# Patient Record
Sex: Female | Born: 1955 | Hispanic: No | Marital: Single | Smoking: Former smoker
Health system: Southern US, Community
[De-identification: ages and names within clinical notes are randomized; demographics above are authoritative.]

## PROBLEM LIST (undated history)

## (undated) DIAGNOSIS — D219 Benign neoplasm of connective and other soft tissue, unspecified: Secondary | ICD-10-CM

## (undated) DIAGNOSIS — I10 Essential (primary) hypertension: Secondary | ICD-10-CM

---

## 2008-07-08 ENCOUNTER — Emergency Department (HOSPITAL_COMMUNITY): Admission: EM | Admit: 2008-07-08 | Discharge: 2008-07-08 | Payer: Self-pay | Admitting: Emergency Medicine

## 2008-07-12 ENCOUNTER — Emergency Department (HOSPITAL_COMMUNITY): Admission: EM | Admit: 2008-07-12 | Discharge: 2008-07-12 | Payer: Self-pay | Admitting: Emergency Medicine

## 2009-03-08 ENCOUNTER — Ambulatory Visit: Payer: Self-pay | Admitting: Internal Medicine

## 2013-06-09 ENCOUNTER — Encounter (HOSPITAL_COMMUNITY): Payer: Self-pay | Admitting: Emergency Medicine

## 2013-06-09 ENCOUNTER — Emergency Department (HOSPITAL_COMMUNITY): Payer: Worker's Compensation

## 2013-06-09 ENCOUNTER — Emergency Department (HOSPITAL_COMMUNITY)
Admission: EM | Admit: 2013-06-09 | Discharge: 2013-06-09 | Disposition: A | Discharge status: Home / Self Care | Payer: Worker's Compensation | Attending: Emergency Medicine | Admitting: Emergency Medicine

## 2013-06-09 DIAGNOSIS — W010XXA Fall on same level from slipping, tripping and stumbling without subsequent striking against object, initial encounter: Secondary | ICD-10-CM | POA: Insufficient documentation

## 2013-06-09 DIAGNOSIS — Y9389 Activity, other specified: Secondary | ICD-10-CM | POA: Insufficient documentation

## 2013-06-09 DIAGNOSIS — F17200 Nicotine dependence, unspecified, uncomplicated: Secondary | ICD-10-CM | POA: Insufficient documentation

## 2013-06-09 DIAGNOSIS — Y929 Unspecified place or not applicable: Secondary | ICD-10-CM | POA: Insufficient documentation

## 2013-06-09 DIAGNOSIS — S8392XA Sprain of unspecified site of left knee, initial encounter: Secondary | ICD-10-CM

## 2013-06-09 DIAGNOSIS — IMO0002 Reserved for concepts with insufficient information to code with codable children: Secondary | ICD-10-CM | POA: Insufficient documentation

## 2013-06-09 MED ORDER — NAPROXEN 500 MG PO TABS: 500.0000 mg | ORAL_TABLET | Freq: Two times a day (BID) | ORAL | Status: AC

## 2013-06-09 NOTE — Progress Notes (Signed)
  CARE MANAGEMENT ED NOTE 06/09/2013  Patient:  Meghan [name], Meghan [name]   Account Number:  [id number]  Date Initiated:  06/09/2013  Documentation initiated by:  Edwyna Shell  Subjective/Objective Assessment:   58 yo female presenting to the ED after [name] past fall and c/o pain of her knees     Subjective/Objective Assessment Detail:   Meghan [name] is [name] 58 y.o. female who presents to the Emergency Department complaining of [name] fall that occurred on 03/31/13. States she tripped and fell onto both of her legs. Pt states she had sudden onset pain that resolved but her left knee started hurting again 3 days ago. She has associated knee swelling. Bearing weight and certain movements worsen the pain. Pt has taken aleve and iced with little relief.     Action/Plan:   Patient is to follow up with provided resources to establish care with [name] PCP   Action/Plan Detail:   Anticipated DC Date:       Status Recommendation to Physician:   Result of Recommendation:  Agreed    DC Planning Services  CM consult  Medication Assistance  PCP issues    Choice offered to / List presented to:  C-1 Patient          Status of service:  Completed, signed off  ED Comments:   ED Comments Detail:  CM consulted for PCP concerns. CM spoke with patient and she confirmed that she did not have [name] PCP. Discussed the importance of [name] PCP especially with health and medications management as she will not receive these services in the ED. Patient verbalized understanding. Reviewed community resources in the community and encouraged the patient to contact one of the established Clinics for medical, medications, and financial counseling. Reviewed specifically the [facility], [facility] at [facility], and [facility]. Patient verbalized understanding and appreciation and was provided with the written resources. EDP updated.

## 2013-06-09 NOTE — ED Notes (Signed)
Case management at bedside with patient.

## 2013-06-09 NOTE — ED Notes (Signed)
Patient states she had a mechanical fall on 05/31/13 and she hurt both her legs.   Patient states she has had swelling and pain in L knee starting on Saturday.

## 2013-06-09 NOTE — ED Provider Notes (Signed)
CSN: [id number]     Arrival date & time 06/09/13  8938 History  This chart was scribed for non-physician practitioner, Jeannett Senior, PA-C working with Merryl Hacker, MD by Frederich Balding, ED scribe. This patient was seen in room TR07C/TR07C and the patient's care was started at 10:39 AM.   Chief Complaint  Patient presents with  . Knee Pain   The history is provided by the patient. No language interpreter was used.   HPI Comments: Meghan [name] is a 58 y.o. female who presents to the Emergency Department complaining of a fall that occurred on 03/31/13. States she tripped and fell onto both of her legs. Pt states she had sudden onset pain that resolved but her left knee started hurting again 3 days ago. She has associated knee swelling. Bearing weight and certain movements worsen the pain. Pt has taken aleve and iced with little relief.   History reviewed. No pertinent past medical history. History reviewed. No pertinent past surgical history. No family history on file. History  Substance Use Topics  . Smoking status: Current Every Day Smoker -- 0.10 packs/day    Types: Cigarettes  . Smokeless tobacco: Not on file  . Alcohol Use: Yes   OB History   Grav Para Term Preterm Abortions TAB SAB Ect Mult Living                 Review of Systems  Musculoskeletal: Positive for arthralgias and joint swelling.  All other systems reviewed and are negative.  Allergies  Review of patient's allergies indicates no known allergies.  Home Medications   Prior to Admission medications   Not on File   BP 177/82  Pulse 71  Temp(Src) 97.6 F (36.4 C) (Oral)  Resp 18  Ht 5\' 3"  (1.6 m)  Wt 160 lb (72.576 kg)  BMI 28.35 kg/m2  SpO2 100%  Physical Exam  Nursing note and vitals reviewed. Constitutional: She is oriented to person, place, and time. She appears well-developed and well-nourished. No distress.  HENT:  Head: Normocephalic and atraumatic.  Eyes: EOM are normal.  Neck:  Neck supple. No tracheal deviation present.  Cardiovascular: Normal rate.   Pulmonary/Chest: Effort normal. No respiratory distress.  Musculoskeletal: Normal range of motion.  Normal appearing left knee. Tender to palpation over medial joint. Full ROM of the knee joint. Pain with full flexion and extension. Negative anterior and posterior drawer signs. No laxity or pain with medial or lateral stress. Dorsal pedal pulses intact   Neurological: She is alert and oriented to person, place, and time.  Skin: Skin is warm and dry.  Psychiatric: She has a normal mood and affect. Her behavior is normal.    ED Course  Procedures (including critical care time)  DIAGNOSTIC STUDIES: Oxygen Saturation is 100% on RA, normal by my interpretation.    COORDINATION OF CARE: 10:43 AM-Discussed treatment plan which includes a knee sleeve and an anti-inflammatory with pt at bedside and pt agreed to plan. Will give pt PCP referrals and advised her to follow up.   Labs Review Labs Reviewed - No data to display  Imaging Review Dg Knee Complete 4 Views Left  06/09/2013   CLINICAL DATA:  LEFT KNEE PAIN  EXAM: LEFT KNEE - COMPLETE 4+ VIEW  COMPARISON:  None.  FINDINGS: There is no evidence of fracture, dislocation, or joint effusion. There is no evidence of arthropathy or other focal bone abnormality. Soft tissues are unremarkable.  IMPRESSION: No acute abnormality noted.   Electronically  Signed   By: Inez Catalina M.D.   On: 06/09/2013 10:26     EKG Interpretation None      MDM   Final diagnoses:  None    Patient with mechanical fall in March. Now having pain in the left knee. Knee exam unremarkable. X-rays negative. No signs of infection to the joint. Patient is ambulatory with no difficulty. Suspect patient has a sprain to the knee versus meniscal injury based on history. I have given her a knee sleeve, home with Naprosyn, followup with orthopedics Dr.  Danley Danker Vitals:   06/09/13 1005  BP: 177/82   Pulse: 71  Temp: 97.6 F (36.4 C)  TempSrc: Oral  Resp: 18  Height: 5\' 3"  (1.6 m)  Weight: 160 lb (72.576 kg)  SpO2: 100%     I personally performed the services described in this documentation, which was scribed in my presence. The recorded information has been reviewed and is accurate.  Renold Genta, PA-C 06/09/13 1806

## 2013-06-09 NOTE — ED Notes (Signed)
Case management called per PA request.   Patient needs help with getting primary care physician and medications.

## 2013-06-09 NOTE — Discharge Instructions (Signed)
Keep knee elevated, ice, wear knee sleeve. Avoid excessive use. Do knee exercises. Follow up with primary care doctor.   Knee Pain Knee pain can be a result of an injury or other medical conditions. Treatment will depend on the cause of your pain. HOME CARE  Only take medicine as told by your doctor.  Keep a healthy weight. Being overweight can make the knee hurt more.  Stretch before exercising or playing sports.  If there is constant knee pain, change the way you exercise. Ask your doctor for advice.  Make sure shoes fit well. Choose the right shoe for the sport or activity.  Protect your knees. Wear kneepads if needed.  Rest when you are tired. GET HELP RIGHT AWAY IF:   Your knee pain does not stop.  Your knee pain does not get better.  Your knee joint feels hot to the touch.  You have a fever. MAKE SURE YOU:   Understand these instructions.  Will watch this condition.  Will get help right away if you are not doing well or get worse. Document Released: 03/30/2008 Document Revised: 03/26/2011 Document Reviewed: 03/30/2008 [facility] Patient Information 2014 [place], [place]. Knee Exercises EXERCISES RANGE OF MOTION(ROM) AND STRETCHING EXERCISES These exercises may help you when beginning to rehabilitate your injury. Your symptoms may resolve with or without further involvement from your physician, physical therapist or athletic trainer. While completing these exercises, remember:   Restoring tissue flexibility helps normal motion to return to the joints. This allows healthier, less painful movement and activity.  An effective stretch should be held for at least 30 seconds.  A stretch should never be painful. You should only feel a gentle lengthening or release in the stretched tissue. STRETCH - Knee Extension, Prone  Lie on your stomach on a firm surface, such as a bed or countertop. Place your right / left knee and leg just beyond the edge of the surface. You may wish  to place a towel under the far end of your right / left thigh for comfort.  Relax your leg muscles and allow gravity to straighten your knee. Your clinician may advise you to add an ankle weight if more resistance is helpful for you.  You should feel a stretch in the back of your right / left knee. Hold this position for __________ seconds. Repeat __________ times. Complete this stretch __________ times per day. * Your physician, physical therapist or athletic trainer may ask you to add ankle weight to enhance your stretch.  RANGE OF MOTION - Knee Flexion, Active  Lie on your back with both knees straight. (If this causes back discomfort, bend your opposite knee, placing your foot flat on the floor.)  Slowly slide your heel back toward your buttocks until you feel a gentle stretch in the front of your knee or thigh.  Hold for __________ seconds. Slowly slide your heel back to the starting position. Repeat __________ times. Complete this exercise __________ times per day.  STRETCH - Quadriceps, Prone   Lie on your stomach on a firm surface, such as a bed or padded floor.  Bend your right / left knee and grasp your ankle. If you are unable to reach, your ankle or pant leg, use a belt around your foot to lengthen your reach.  Gently pull your heel toward your buttocks. Your knee should not slide out to the side. You should feel a stretch in the front of your thigh and/or knee.  Hold this position for __________ seconds.  Repeat __________ times. Complete this stretch __________ times per day.  STRETCH  Hamstrings, Supine   Lie on your back. Loop a belt or towel over the ball of your right / left foot.  Straighten your right / left knee and slowly pull on the belt to raise your leg. Do not allow the right / left knee to bend. Keep your opposite leg flat on the floor.  Raise the leg until you feel a gentle stretch behind your right / left knee or thigh. Hold this position for __________  seconds. Repeat __________ times. Complete this stretch __________ times per day.  STRENGTHENING EXERCISES These exercises may help you when beginning to rehabilitate your injury. They may resolve your symptoms with or without further involvement from your physician, physical therapist or athletic trainer. While completing these exercises, remember:   Muscles can gain both the endurance and the strength needed for everyday activities through controlled exercises.  Complete these exercises as instructed by your physician, physical therapist or athletic trainer. Progress the resistance and repetitions only as guided.  You may experience muscle soreness or fatigue, but the pain or discomfort you are trying to eliminate should never worsen during these exercises. If this pain does worsen, stop and make certain you are following the directions exactly. If the pain is still present after adjustments, discontinue the exercise until you can discuss the trouble with your clinician. STRENGTH - Quadriceps, Isometrics  Lie on your back with your right / left leg extended and your opposite knee bent.  Gradually tense the muscles in the front of your right / left thigh. You should see either your knee cap slide up toward your hip or increased dimpling just above the knee. This motion will push the back of the knee down toward the floor/mat/bed on which you are lying.  Hold the muscle as tight as you can without increasing your pain for __________ seconds.  Relax the muscles slowly and completely in between each repetition. Repeat __________ times. Complete this exercise __________ times per day.  STRENGTH - Quadriceps, Short Arcs   Lie on your back. Place a __________ inch towel roll under your knee so that the knee slightly bends.  Raise only your lower leg by tightening the muscles in the front of your thigh. Do not allow your thigh to rise.  Hold this position for __________ seconds. Repeat  __________ times. Complete this exercise __________ times per day.  OPTIONAL ANKLE WEIGHTS: Begin with ____________________, but DO NOT exceed ____________________. Increase in 1 pound/0.5 kilogram increments.  STRENGTH - Quadriceps, Straight Leg Raises  Quality counts! Watch for signs that the quadriceps muscle is working to insure you are strengthening the correct muscles and not "cheating" by substituting with healthier muscles.  Lay on your back with your right / left leg extended and your opposite knee bent.  Tense the muscles in the front of your right / left thigh. You should see either your knee cap slide up or increased dimpling just above the knee. Your thigh may even quiver.  Tighten these muscles even more and raise your leg 4 to 6 inches off the floor. Hold for __________ seconds.  Keeping these muscles tense, lower your leg.  Relax the muscles slowly and completely in between each repetition. Repeat __________ times. Complete this exercise __________ times per day.  STRENGTH - Hamstring, Curls  Lay on your stomach with your legs extended. (If you lay on a bed, your feet may hang over the edge.)  Tighten the muscles in the back of your thigh to bend your right / left knee up to 90 degrees. Keep your hips flat on the bed/floor.  Hold this position for __________ seconds.  Slowly lower your leg back to the starting position. Repeat __________ times. Complete this exercise __________ times per day.  OPTIONAL ANKLE WEIGHTS: Begin with ____________________, but DO NOT exceed ____________________. Increase in 1 pound/0.5 kilogram increments.  STRENGTH  Quadriceps, Squats  Stand in a door frame so that your feet and knees are in line with the frame.  Use your hands for balance, not support, on the frame.  Slowly lower your weight, bending at the hips and knees. Keep your lower legs upright so that they are parallel with the door frame. Squat only within the range that does not  increase your knee pain. Never let your hips drop below your knees.  Slowly return upright, pushing with your legs, not pulling with your hands. Repeat __________ times. Complete this exercise __________ times per day.  STRENGTH - Quadriceps, Wall Slides  Follow guidelines for form closely. Increased knee pain often results from poorly placed feet or knees.  Lean against a smooth wall or door and walk your feet out 18-24 inches. Place your feet hip-width apart.  Slowly slide down the wall or door until your knees bend __________ degrees.* Keep your knees over your heels, not your toes, and in line with your hips, not falling to either side.  Hold for __________ seconds. Stand up to rest for __________ seconds in between each repetition. Repeat __________ times. Complete this exercise __________ times per day. * Your physician, physical therapist or athletic trainer will alter this angle based on your symptoms and progress. Document Released: 11/15/2004 Document Revised: 03/26/2011 Document Reviewed: 04/15/2008 [facility] Patient Information 2014 [place], [place].

## 2013-06-09 NOTE — ED Provider Notes (Signed)
Medical screening examination/treatment/procedure(s) were performed by non-physician practitioner and as supervising physician I was immediately available for consultation/collaboration.   EKG Interpretation None       Merryl Hacker, MD 06/09/13 [place]

## 2014-03-26 ENCOUNTER — Other Ambulatory Visit: Payer: Self-pay

## 2014-03-26 DIAGNOSIS — Z1231 Encounter for screening mammogram for malignant neoplasm of breast: Secondary | ICD-10-CM

## 2014-04-09 ENCOUNTER — Ambulatory Visit
Admission: RE | Admit: 2014-04-09 | Discharge: 2014-04-09 | Disposition: A | Discharge status: Home / Self Care | Payer: 59 | Source: Ambulatory Visit

## 2014-04-09 DIAGNOSIS — Z1231 Encounter for screening mammogram for malignant neoplasm of breast: Secondary | ICD-10-CM

## 2014-04-12 ENCOUNTER — Other Ambulatory Visit: Payer: Self-pay | Admitting: Family Medicine

## 2014-04-12 DIAGNOSIS — R928 Other abnormal and inconclusive findings on diagnostic imaging of breast: Secondary | ICD-10-CM

## 2014-04-13 ENCOUNTER — Ambulatory Visit
Admission: RE | Admit: 2014-04-13 | Discharge: 2014-04-13 | Disposition: A | Discharge status: Home / Self Care | Payer: 59 | Source: Ambulatory Visit | Attending: Family Medicine | Admitting: Family Medicine

## 2014-04-13 DIAGNOSIS — R928 Other abnormal and inconclusive findings on diagnostic imaging of breast: Secondary | ICD-10-CM

## 2014-05-20 ENCOUNTER — Other Ambulatory Visit (HOSPITAL_COMMUNITY)
Admission: RE | Admit: 2014-05-20 | Discharge: 2014-05-20 | Disposition: A | Discharge status: Home / Self Care | Payer: 59 | Source: Ambulatory Visit | Attending: Obstetrics & Gynecology | Admitting: Obstetrics & Gynecology

## 2014-05-20 ENCOUNTER — Other Ambulatory Visit (HOSPITAL_COMMUNITY): Payer: Self-pay | Admitting: Obstetrics & Gynecology

## 2014-05-20 DIAGNOSIS — Z1151 Encounter for screening for human papillomavirus (HPV): Secondary | ICD-10-CM | POA: Insufficient documentation

## 2014-05-20 DIAGNOSIS — Z01419 Encounter for gynecological examination (general) (routine) without abnormal findings: Secondary | ICD-10-CM | POA: Insufficient documentation

## 2014-05-20 DIAGNOSIS — Z113 Encounter for screening for infections with a predominantly sexual mode of transmission: Secondary | ICD-10-CM | POA: Insufficient documentation

## 2014-05-21 LAB — CYTOLOGY - PAP

## 2014-11-22 ENCOUNTER — Emergency Department (HOSPITAL_COMMUNITY)
Admission: EM | Admit: 2014-11-22 | Discharge: 2014-11-22 | Disposition: A | Discharge status: Home / Self Care | Payer: 59 | Attending: Emergency Medicine | Admitting: Emergency Medicine

## 2014-11-22 ENCOUNTER — Emergency Department (HOSPITAL_COMMUNITY): Payer: 59

## 2014-11-22 ENCOUNTER — Encounter (HOSPITAL_COMMUNITY): Payer: Self-pay | Admitting: *Deleted

## 2014-11-22 DIAGNOSIS — H109 Unspecified conjunctivitis: Secondary | ICD-10-CM | POA: Insufficient documentation

## 2014-11-22 DIAGNOSIS — Z7982 Long term (current) use of aspirin: Secondary | ICD-10-CM | POA: Insufficient documentation

## 2014-11-22 DIAGNOSIS — Z791 Long term (current) use of non-steroidal anti-inflammatories (NSAID): Secondary | ICD-10-CM | POA: Insufficient documentation

## 2014-11-22 DIAGNOSIS — Z720 Tobacco use: Secondary | ICD-10-CM | POA: Insufficient documentation

## 2014-11-22 DIAGNOSIS — J069 Acute upper respiratory infection, unspecified: Secondary | ICD-10-CM | POA: Insufficient documentation

## 2014-11-22 LAB — RAPID STREP SCREEN (MED CTR MEBANE ONLY): Streptococcus, Group A Screen (Direct): NEGATIVE

## 2014-11-22 MED ORDER — AZITHROMYCIN 250 MG PO TABS: ORAL_TABLET | ORAL | Status: AC

## 2014-11-22 MED ORDER — POLYMYXIN B-TRIMETHOPRIM 10000-0.1 UNIT/ML-% OP SOLN
1.0000 [drp] | Freq: Once | OPHTHALMIC | Status: AC
  Administered 2014-11-22: 1 [drp] via OPHTHALMIC
  Filled 2014-11-22: qty 10

## 2014-11-22 MED ORDER — POLYMYXIN B-TRIMETHOPRIM 10000-0.1 UNIT/ML-% OP SOLN: 1.0000 [drp] | OPHTHALMIC | Status: AC

## 2014-11-22 NOTE — ED Provider Notes (Signed)
CSN: [id number]     Arrival date & time 11/22/14  1217 History  By signing my name below, I, Stephania Fragmin, attest that this documentation has been prepared under the direction and in the presence of [employer], PA-C. Electronically Signed: Stephania Fragmin, ED Scribe. 11/22/2014. 2:14 PM.    Chief Complaint  Patient presents with  . Sore Throat   The history is provided by the patient. No language interpreter was used.   HPI Comments: Meghan [name] is a 59 y.o. female who presents to the Emergency Department with multiple complaints, including a gradual-onset constant, sore throat that began 4 days ago. Associated symptoms include a subjective fever, although she states she did not measure it, a productive cough with phlegm, rhinorrhea, eye redness, and tight ear pain. Patient has a history of frequent strep throat and states this feels like strep. Patient states she is an occasional smoker. She states she is in between primary care providers at this point. She denies chest pain or SOB, abdominal pain, nausea, vomiting, calf pain, or leg swelling.   History reviewed. No pertinent past medical history. History reviewed. No pertinent past surgical history. History reviewed. No pertinent family history. Social History  Substance Use Topics  . Smoking status: Current Every Day Smoker -- 0.10 packs/day    Types: Cigarettes  . Smokeless tobacco: None  . Alcohol Use: Yes   OB History    No data available     Review of Systems A complete 10 system review of systems was obtained and all systems are negative except as noted in the HPI and PMH.    Allergies  Review of patient's allergies indicates no known allergies.  Home Medications   Prior to Admission medications   Medication Sig Start Date End Date Taking? Authorizing Provider  aspirin EC 81 MG tablet Take 81 mg by mouth daily.    Historical Provider, MD  naproxen (NAPROSYN) 500 MG tablet Take 1 tablet (500 mg total) by mouth 2 (two)  times daily. 06/09/13   Tatyana Kirichenko, PA-C   BP 177/84 mmHg  Pulse 72  Temp(Src) 98 F (36.7 C) (Oral)  Resp 18  SpO2 96% Physical Exam  Constitutional: She is oriented to person, place, and time. She appears well-developed and well-nourished. No distress.  HENT:  Head: Normocephalic and atraumatic.  Mouth/Throat: No oropharyngeal exudate.  No tonsillar hypertrophy or exudate.   Eyes: Conjunctivae and EOM are normal. Right eye exhibits discharge.  The left eye is slightly injected, with clear drainage.   Neck: Neck supple. No tracheal deviation present.  There is no anterior cervical lymphadenopathy.   Cardiovascular: Normal rate, regular rhythm, normal heart sounds and intact distal pulses.  Exam reveals no gallop and no friction rub.   No murmur heard. Pulmonary/Chest: Effort normal and breath sounds normal. No respiratory distress. She has no wheezes. She has no rales. She exhibits no tenderness.  Abdominal: Soft. Bowel sounds are normal. She exhibits no distension and no mass. There is no tenderness. There is no rebound and no guarding.  Musculoskeletal: Normal range of motion.  Neurological: She is alert and oriented to person, place, and time.  Skin: Skin is warm and dry.  Psychiatric: She has a normal mood and affect. Her behavior is normal.  Nursing note and vitals reviewed.   ED Course  Procedures (including critical care time)  DIAGNOSTIC STUDIES: Oxygen Saturation is 96% on RA, normal by my interpretation.    COORDINATION OF CARE: 1:37 PM - Patient  made aware of negative strep screen. Suspect viral infection. However, will order CXR to r/o any possible bacterial infections. Pt verbalized understanding and agreed to plan.   Labs Review Labs Reviewed  RAPID STREP SCREEN (NOT AT [facility])  CULTURE, GROUP A STREP    Imaging Review Dg Chest 2 View  11/22/2014  CLINICAL DATA:  59 year old female cough congestion and fever for 1 week. Initial encounter. EXAM: CHEST   2 VIEW COMPARISON:  None. FINDINGS: Mildly to moderately tortuous descending thoracic aorta. Cardiac size at the upper limits of normal. Other mediastinal contours are within normal limits. Visualized tracheal air column is within normal limits. No pneumothorax, pulmonary edema, pleural effusion or confluent pulmonary opacity. Lung volumes are at the upper limits of normal. No acute osseous abnormality identified. IMPRESSION: No acute cardiopulmonary abnormality. Electronically Signed   By: Genevie Ann M.D.   On: 11/22/2014 14:07   I have personally reviewed and evaluated these images and lab results as part of my medical decision-making.   MDM   Final diagnoses:  None    Filed Vitals:   11/22/14 1247 11/22/14 1425  BP: 177/84 179/76  Pulse: 72 74  Temp: 98 F (36.7 C)   TempSrc: Oral   Resp: 18 18  SpO2: 96% 97%    Medications  trimethoprim-polymyxin b (POLYTRIM) ophthalmic solution 1 drop (1 drop Left Eye Given 11/22/14 1425)    Meghan [name] is 59 y.o. female presenting with rhinorrhea, sore throat, left eye injection patient is reporting productive cough. Patient states this is similar to prior strep exacerbations, relatively insistent that this is strep despite my reassurance that the physical exam, symptoms and rapid strep test or not. Chest x-ray negative. Patient given when necessary prescription for azithromycin and Polytrim. I have explained to this patient that if she takes antibiotics when they are not indicated they will not work well when she really needs them. Patient verbalized her understanding. Patient is given a resource guide to establish primary care.  Evaluation does not show pathology that would require ongoing emergent intervention or inpatient treatment. Pt is hemodynamically stable and mentating appropriately. Discussed findings and plan with patient/guardian, who agrees with care plan. All questions answered. Return precautions discussed and outpatient follow up  given.   Discharge Medication List as of 11/22/2014  2:15 PM    START taking these medications   Details  azithromycin (ZITHROMAX Z-PAK) 250 MG tablet 2 po day one, then 1 daily x 4 days, Print    trimethoprim-polymyxin b (POLYTRIM) ophthalmic solution Place 1 drop into the left eye every 4 (four) hours., Starting 11/22/2014, Until Discontinued, Print             Monico Blitz, PA-C 11/22/14 [place], MD 11/25/14 [phone]

## 2014-11-22 NOTE — Discharge Instructions (Signed)
Rest, stay well-hydrated and you may also consider taking vitamin C. Do not sniffle the nose, blow forward whenever you feel drainage. If you are not feeling better in 3-5 days or if your symptoms worsen with fever, cough or shortness of breath - start antibiotics at that time. DO NOT HESITATE to return to the emergency department for concerning symptoms.   Do not hesitate to return to the emergency room for any new, worsening or concerning symptoms.  Please obtain primary care using resource guide below. Let them know that you were seen in the emergency room and that they will need to obtain records for further outpatient management.   Upper Respiratory Infection, Adult Most upper respiratory infections (URIs) are a viral infection of the air passages leading to the lungs. A URI affects the nose, throat, and upper air passages. The most common type of URI is nasopharyngitis and is typically referred to as "the common cold." URIs run their course and usually go away on their own. Most of the time, a URI does not require medical attention, but sometimes a bacterial infection in the upper airways can follow a viral infection. This is called a secondary infection. Sinus and middle ear infections are common types of secondary upper respiratory infections. Bacterial pneumonia can also complicate a URI. A URI can worsen asthma and chronic obstructive pulmonary disease (COPD). Sometimes, these complications can require emergency medical care and may be life threatening.  CAUSES Almost all URIs are caused by viruses. A virus is a type of germ and can spread from one person to another.  RISKS FACTORS You may be at risk for a URI if:   You smoke.   You have chronic heart or lung disease.  You have a weakened defense (immune) system.   You are very young or very old.   You have nasal allergies or asthma.  You work in crowded or poorly ventilated areas.  You work in health care facilities or  schools. SIGNS AND SYMPTOMS  Symptoms typically develop 2-3 days after you come in contact with a cold virus. Most viral URIs last 7-10 days. However, viral URIs from the influenza virus (flu virus) can last 14-18 days and are typically more severe. Symptoms may include:   Runny or stuffy (congested) nose.   Sneezing.   Cough.   Sore throat.   Headache.   Fatigue.   Fever.   Loss of appetite.   Pain in your forehead, behind your eyes, and over your cheekbones (sinus pain).  Muscle aches.  DIAGNOSIS  Your health care provider may diagnose a URI by:  Physical exam.  Tests to check that your symptoms are not due to another condition such as:  Strep throat.  Sinusitis.  Pneumonia.  Asthma. TREATMENT  A URI goes away on its own with time. It cannot be cured with medicines, but medicines may be prescribed or recommended to relieve symptoms. Medicines may help:  Reduce your fever.  Reduce your cough.  Relieve nasal congestion. HOME CARE INSTRUCTIONS   Take medicines only as directed by your health care provider.   Gargle warm saltwater or take cough drops to comfort your throat as directed by your health care provider.  Use a warm mist humidifier or inhale steam from a shower to increase air moisture. This may make it easier to breathe.  Drink enough fluid to keep your urine clear or pale yellow.   Eat soups and other clear broths and maintain good nutrition.   Rest  as needed.   Return to work when your temperature has returned to normal or as your health care provider advises. You may need to stay home longer to avoid infecting others. You can also use a face mask and careful hand washing to prevent spread of the virus.  Increase the usage of your inhaler if you have asthma.   Do not use any tobacco products, including cigarettes, chewing tobacco, or electronic cigarettes. If you need help quitting, ask your health care provider. PREVENTION    The best way to protect yourself from getting a cold is to practice good hygiene.   Avoid oral or hand contact with people with cold symptoms.   Wash your hands often if contact occurs.  There is no clear evidence that vitamin C, vitamin E, echinacea, or exercise reduces the chance of developing a cold. However, it is always recommended to get plenty of rest, exercise, and practice good nutrition.  SEEK MEDICAL CARE IF:   You are getting worse rather than better.   Your symptoms are not controlled by medicine.   You have chills.  You have worsening shortness of breath.  You have brown or red mucus.  You have yellow or brown nasal discharge.  You have pain in your face, especially when you bend forward.  You have a fever.  You have swollen neck glands.  You have pain while swallowing.  You have white areas in the back of your throat. SEEK IMMEDIATE MEDICAL CARE IF:   You have severe or persistent:  Headache.  Ear pain.  Sinus pain.  Chest pain.  You have chronic lung disease and any of the following:  Wheezing.  Prolonged cough.  Coughing up blood.  A change in your usual mucus.  You have a stiff neck.  You have changes in your:  Vision.  Hearing.  Thinking.  Mood. MAKE SURE YOU:   Understand these instructions.  Will watch your condition.  Will get help right away if you are not doing well or get worse.   This information is not intended to replace advice given to you by your health care provider. Make sure you discuss any questions you have with your health care provider.   Document Released: 06/27/2000 Document Revised: 05/18/2014 Document Reviewed: 04/08/2013 Elsevier Interactive Patient Education 2016 [employer].   Emergency Department Resource Guide 1) Find a Doctor and Pay Out of Pocket Although you won't have to find out who is covered by your insurance plan, it is a good idea to ask around and get recommendations. You will  then need to call the office and see if the doctor you have chosen will accept you as a new patient and what types of options they offer for patients who are self-pay. Some doctors offer discounts or will set up payment plans for their patients who do not have insurance, but you will need to ask so you aren't surprised when you get to your appointment.  2) Contact Your Local Health Department Not all health departments have doctors that can see patients for sick visits, but many do, so it is worth a call to see if yours does. If you don't know where your local health department is, you can check in your phone book. The CDC also has a tool to help you locate your state's health department, and many state websites also have listings of all of their local health departments.  3) Find a [facility] If your illness is not likely to  be very severe or complicated, you may want to try a walk in clinic. These are popping up all over the country in pharmacies, drugstores, and shopping centers. They're usually staffed by nurse practitioners or physician assistants that have been trained to treat common illnesses and complaints. They're usually fairly quick and inexpensive. However, if you have serious medical issues or chronic medical problems, these are probably not your best option.  No Primary Care Doctor: - Call Health Connect at  [phone] - they can help you locate a primary care doctor that  accepts your insurance, provides certain services, etc. - Physician Referral Service- [phone]  Chronic Pain Problems: Organization         Address  Phone   Notes  [facility]  [phone] Patients need to be referred by their primary care doctor.   Medication Assistance: Organization         Address  Phone   Notes  [facility] Medication [facility] [address] [place], [place] [address] [phone] --Must be a resident of [facility] -- Must have NO  insurance coverage whatsoever (no Medicaid/ Medicare, etc.) -- The pt. MUST have a primary care doctor that directs their care regularly and follows them in the community   [facility]  [phone]   [employer]  [phone]    Agencies that provide inexpensive medical care: Organization         Address  Phone   Notes  [place]  [phone]   [facility]    [phone]   [facility] [place], [place] [address] [phone]   [place] [address] [place] [phone]   Planned Parenthood    [phone]   [facility]    [phone]   [place] and [place] [address], [place] Phone:  [phone], Fax:  [phone] Hours of Operation:  9 am - 6 pm, M-F.  Also accepts Medicaid/Medicare and self-pay.  [facility] for [place], [address], [place] Phone: [phone], Fax: [phone]. Hours of Operation:  8:30 am - 5:30 pm, M-F.  Also accepts Medicaid and self-pay.  [facility] [address] [place] Phone: [phone]   [place], [place], [place] [phone], Ext. [phone] Mondays & Thursdays: 7-9 AM.  First 15 patients are seen on a first come, first serve basis.    [place] Providers:  Organization         Address  Phone   Notes  [facility] [address], [place] [phone] Also accepts self-pay patients.  [facility] [address], [place]  [phone]   [place], [address], [place] [phone]   [facility] [address] [place] [phone]   Lucianne Lei [address], [place]   [phone] Only accepts [place] Access [place] patients after they have  their name applied to their card.   Self-Pay (no insurance) in [facility]:  Organization         Address  Phone   Notes  Sickle Cell Patients, [facility] Internal Medicine [place] [phone]   [facility] Urgent  Care [place] [phone]   [facility] [place]  [place], [address], [place] [phone]   [facility]/Dr. Osei-Bonsu  [address], [place] or [address], [place] [phone] Phone number for both [place] and [place] locations is the same.  Urgent Medical and [facility] [address], [place] [phone]   [facility] [address], [place] or [address] [phone]   [facility] [address], [place] [phone], phone; [phone], fax Sees patients 1st and 3rd Saturday of every month.  Must not qualify for public or private insurance (i.e. Medicaid, Medicare, [place] Health Choice, Veterans' Benefits)  Household income should be no more than 200% of the poverty level The clinic cannot treat you if you are pregnant or think you are pregnant  Sexually transmitted diseases are not treated at the clinic.    Dental Care: Organization         Address  Phone  Notes  [facility] [place] [phone] Accepts children up to age 58 who are enrolled in [place] or [place]; pregnant women with a Medicaid card; and children who have applied for Medicaid or [place] Health Choice, but were declined, whose parents can pay a reduced fee at time of service.  [facility]  [address], [place] [phone] Accepts children up to age 24 who are enrolled in [place] or [place]; pregnant women with a Medicaid card; and children who have applied  for Medicaid or [place] Health Choice, but were declined, whose parents can pay a reduced fee at time of service.  [place] Adult [facility] Access PROGRAM  [place] [phone] Patients are seen by appointment only. Walk-ins are not accepted. [place] will see patients 110 years of age and older. Monday - Tuesday (8am-5pm) Most Wednesdays (8:30-5pm) $30 per visit, cash only  [facility] Adult Dental Access PROGRAM  [address], [facility] [phone] Patients are seen by appointment only. Walk-ins are not accepted. [place] will see patients 63 years of age and older. One Wednesday Evening (Monthly: Volunteer Based).  $30 per visit, cash only  [place]  [phone] for adults; Children under age 67, call Graduate Pediatric Dentistry at [phone]. Children aged 27-14, please call [phone] to request a pediatric application.  Dental services are provided in all areas of dental care including fillings, crowns and bridges, complete and partial dentures, implants, gum treatment, root canals, and extractions. Preventive care is also provided. Treatment is provided to both adults and children. Patients are selected via a lottery and there is often a waiting list.   [facility] [address] [place]  [phone] [email or web address]   [facility] [address] [place], [place] [phone], Ext. [phone] Second and Fourth Thursday of each month, opens at 6:30 AM; Clinic ends at 9 AM.  Patients are seen on a first-come first-served basis, and a limited number are seen during each clinic.   [facility]  [address] Hillard Danker [place], [place] [phone]   Eligibility Requirements You must have lived in [place], [place], or [place] counties for at least the last three months.  You cannot be eligible for state or federal sponsored [employer], including [employer],  [place], or [employer].   You generally cannot be eligible for healthcare insurance through your employer.    How to apply: Eligibility screenings are held every Tuesday and Wednesday afternoon from 1:00 pm until 4:00 pm. You do not need an appointment for the interview!  [facility] [address] [place], [place]   [place]  [place] Department  [place]  [phone]    Behavioral Health Resources in the Community: Intensive Outpatient Programs Organization         Address  Phone  Notes  [place]. [address] [place], [place] [phone]   [facility] Outpatient [address] [place], [place]   ADS: Alcohol & Drug Svcs [address], [place], [place]   [place] [address]  [place], [place] or [phone]   Substance Abuse Resources Organization         Address  Phone  Notes  Alcohol and Drug Services  [phone]   [place]  [phone]   [facility] [place]   Chinita Pester  [phone]   Residential & Outpatient Substance Abuse Program  [phone]   Psychological Services Organization         Address  Phone  Notes  [facility] [place]  [place]  [phone]   [place] [address] [place] or [phone]    Mobile Crisis Teams Organization         Address  Phone  Notes  [facility], Mobile Crisis Care Unit  [phone]   [facility]  [address] [place], [place]   [place] Levels [address] [place] [phone]    Self-Help/Support Groups Organization         Address  Phone             Notes  [place]. of [place] - variety of support  groups  [place] Call for more information  Narcotics Anonymous (NA), [facility] [address], [employer] [place]  2 meetings at this location   Special educational needs teacher         Address  Phone  Notes  [facility] [place],    [place]  [phone]   [facility]  [address] [place] 932671, [place], [place]   [place], [place] [phone] Admissions: 8am-3pm M-F  Incentives Substance [place] [address]    [place], [place] [phone]   [facility] [address] [place], [place], [place]   [facility] [address],  [place], [place]   [facility] - Intensive Outpatient [address] Kristeen [address], [place], [place]   [facility] ([place] [address]  [place], [place] [phone] or [phone]   [facility] Services (RTS) [address], [place], [place] Accepts Medicaid  [facility] [place] [address],  [place] [phone] Substance Abuse/Addiction Treatment   [facility] Organization         Address  Phone  Notes  [facility]  [phone]   Domenic Schwab, PhD [phone] Coach  RdBraulio Bosch, [place]   [phone] or ([place]) [place], [place] [phone]   [facility] Recovery [address] [place], [place] [phone] Insurance/Medicaid/sponsorship through [facility] and Families [address], Ste [place], [place] [phone] [place], [place] [phone]    Dr. Adele Schilder  [phone]   [facility] Dept. 1) [address] [employer] 2) [place] 3)   Benjamin [address], [place] [phone]  [phone]   [place] [phone] or [phone] (After Hours)

## 2014-11-22 NOTE — ED Notes (Signed)
Pt reports sore throat and cold symptoms since last week, hx of frequent strep throat and states she feels like this is strep. Airway intact.

## 2014-11-24 LAB — CULTURE, GROUP A STREP: Strep A Culture: NEGATIVE

## 2017-01-22 DIAGNOSIS — M65311 Trigger thumb, right thumb: Secondary | ICD-10-CM | POA: Diagnosis not present

## 2017-03-14 ENCOUNTER — Other Ambulatory Visit: Payer: Self-pay | Admitting: Family Medicine

## 2017-03-14 ENCOUNTER — Other Ambulatory Visit (HOSPITAL_COMMUNITY)
Admission: RE | Admit: 2017-03-14 | Discharge: 2017-03-14 | Disposition: A | Discharge status: Home / Self Care | Payer: 59 | Source: Ambulatory Visit | Attending: Family Medicine | Admitting: Family Medicine

## 2017-03-14 DIAGNOSIS — Z13220 Encounter for screening for lipoid disorders: Secondary | ICD-10-CM | POA: Diagnosis not present

## 2017-03-14 DIAGNOSIS — I10 Essential (primary) hypertension: Secondary | ICD-10-CM | POA: Diagnosis not present

## 2017-03-14 DIAGNOSIS — Z124 Encounter for screening for malignant neoplasm of cervix: Secondary | ICD-10-CM | POA: Diagnosis not present

## 2017-03-14 DIAGNOSIS — Z0000 Encounter for general adult medical examination without abnormal findings: Secondary | ICD-10-CM | POA: Diagnosis not present

## 2017-03-14 DIAGNOSIS — Z23 Encounter for immunization: Secondary | ICD-10-CM | POA: Diagnosis not present

## 2017-03-19 LAB — CYTOLOGY - PAP
Adequacy: ABSENT
CHLAMYDIA, DNA PROBE: NEGATIVE
Diagnosis: NEGATIVE
HPV (WINDOPATH): NOT DETECTED
Neisseria Gonorrhea: NEGATIVE

## 2017-04-29 ENCOUNTER — Other Ambulatory Visit: Payer: Self-pay | Admitting: Family Medicine

## 2017-04-29 DIAGNOSIS — Z1231 Encounter for screening mammogram for malignant neoplasm of breast: Secondary | ICD-10-CM

## 2017-05-13 DIAGNOSIS — I10 Essential (primary) hypertension: Secondary | ICD-10-CM | POA: Diagnosis not present

## 2017-05-20 ENCOUNTER — Ambulatory Visit
Admission: RE | Admit: 2017-05-20 | Discharge: 2017-05-20 | Disposition: A | Discharge status: Home / Self Care | Payer: 59 | Source: Ambulatory Visit | Attending: Family Medicine | Admitting: Family Medicine

## 2017-05-20 DIAGNOSIS — Z1231 Encounter for screening mammogram for malignant neoplasm of breast: Secondary | ICD-10-CM

## 2017-06-17 ENCOUNTER — Other Ambulatory Visit: Payer: Self-pay

## 2017-06-17 ENCOUNTER — Encounter (HOSPITAL_COMMUNITY): Payer: Self-pay | Admitting: Emergency Medicine

## 2017-06-17 ENCOUNTER — Emergency Department (HOSPITAL_COMMUNITY)
Admission: EM | Admit: 2017-06-17 | Discharge: 2017-06-17 | Disposition: A | Discharge status: Home / Self Care | Payer: 59 | Attending: Emergency Medicine | Admitting: Emergency Medicine

## 2017-06-17 DIAGNOSIS — I10 Essential (primary) hypertension: Secondary | ICD-10-CM | POA: Diagnosis not present

## 2017-06-17 DIAGNOSIS — Z7982 Long term (current) use of aspirin: Secondary | ICD-10-CM | POA: Insufficient documentation

## 2017-06-17 DIAGNOSIS — T7840XA Allergy, unspecified, initial encounter: Secondary | ICD-10-CM | POA: Diagnosis not present

## 2017-06-17 DIAGNOSIS — L500 Allergic urticaria: Secondary | ICD-10-CM | POA: Diagnosis not present

## 2017-06-17 DIAGNOSIS — Z79899 Other long term (current) drug therapy: Secondary | ICD-10-CM | POA: Diagnosis not present

## 2017-06-17 DIAGNOSIS — F17210 Nicotine dependence, cigarettes, uncomplicated: Secondary | ICD-10-CM | POA: Diagnosis not present

## 2017-06-17 DIAGNOSIS — R21 Rash and other nonspecific skin eruption: Secondary | ICD-10-CM | POA: Diagnosis present

## 2017-06-17 HISTORY — DX: Essential (primary) hypertension: I10

## 2017-06-17 MED ORDER — PREDNISONE 20 MG PO TABS
40.0000 mg | ORAL_TABLET | Freq: Once | ORAL | Status: AC
  Administered 2017-06-17: 40 mg via ORAL
  Filled 2017-06-17: qty 2

## 2017-06-17 MED ORDER — PREDNISONE 10 MG PO TABS: ORAL_TABLET | ORAL | 0 refills | Status: DC

## 2017-06-17 MED ORDER — DIPHENHYDRAMINE HCL 25 MG PO CAPS
25.0000 mg | ORAL_CAPSULE | Freq: Once | ORAL | Status: AC
  Administered 2017-06-17: 25 mg via ORAL
  Filled 2017-06-17: qty 1

## 2017-06-17 NOTE — Discharge Instructions (Addendum)
Please read attached information. If you experience any new or worsening signs or symptoms please return to the emergency room for evaluation. Please follow-up with your primary care provider or specialist as discussed. Please use medication prescribed only as directed and discontinue taking if you have any concerning signs or symptoms.   °

## 2017-06-17 NOTE — ED Provider Notes (Signed)
[facility] EMERGENCY DEPARTMENT Provider Note   CSN: [id number] Arrival date & time: 06/17/17  1157     History   Chief Complaint Chief Complaint  Patient presents with  . Allergic Reaction    HPI Meghan [name] is a 62 y.o. female.  HPI   62 year old female presents today with complaints of allergic reaction.  Patient reports that 2 weeks ago she was switched blood pressure medications and placed on Benicar as they could not refill her old blood pressure medication.  She notes that 4 days ago she noticed some itching and redness to her upper extremities lips and face.  Patient denies shortness of breath, swelling of the throat or mouth history of the same.  Patient notes 2 weeks ago she had poison ivy but notes this had resolved.  No exposures.  She reports she is not diabetic.  Patient reports she stopped taking her blood pressure medication on Saturday approximately 3 days ago.  She notes that her systolic blood pressure is usually in the 200s.  Past Medical History:  Diagnosis Date  . Hypertension     There are no active problems to display for this patient.   No past surgical history on file.   OB History   None      Home Medications    Prior to Admission medications   Medication Sig Start Date End Date Taking? Authorizing Provider  aspirin EC 81 MG tablet Take 81 mg by mouth daily.    [provider]  azithromycin (ZITHROMAX Z-PAK) 250 MG tablet 2 po day one, then 1 daily x 4 days 11/22/14   Pisciotta, Elmyra Ricks, PA-C  naproxen (NAPROSYN) 500 MG tablet Take 1 tablet (500 mg total) by mouth 2 (two) times daily. 06/09/13   Kirichenko, Tatyana, PA-C  predniSONE (DELTASONE) 10 MG tablet Please take 4 tabs daily for 5 days. Day six 3 tabs, Day seven 2 tabs, Day eight 1 tab 06/17/17   Ethlyn Alto, Dellis Filbert, PA-C  trimethoprim-polymyxin b (POLYTRIM) ophthalmic solution Place 1 drop into the left eye every 4 (four) hours. 11/22/14   Pisciotta, Charna Elizabeth     Family History No family history on file.  Social History Social History   Tobacco Use  . Smoking status: Current Every Day Smoker    Packs/day: 0.10    Types: Cigarettes  Substance Use Topics  . Alcohol use: Yes  . Drug use: No     Allergies   Patient has no allergy information on record.   Review of Systems Review of Systems  All other systems reviewed and are negative.    Physical Exam Updated Vital Signs BP (!) 182/88   Pulse 82   Temp 98.9 F (37.2 C)   Resp 18   Ht 5\' 4"  (1.626 m)   Wt 80.3 kg (177 lb)   SpO2 100%   BMI 30.38 kg/m   Physical Exam  Constitutional: She is oriented to person, place, and time. She appears well-developed and well-nourished.  HENT:  Head: Normocephalic and atraumatic.  Eyes: Pupils are equal, round, and reactive to light. Conjunctivae are normal. Right eye exhibits no discharge. Left eye exhibits no discharge. No scleral icterus.  Neck: Normal range of motion. No JVD present. No tracheal deviation present.  Pulmonary/Chest: Effort normal. No stridor.  Neurological: She is alert and oriented to person, place, and time. Coordination normal.  Skin:  Pruritic papular rash to the upper extremities face and lips, no intraoral involvement no swelling or edema  no pooling of secretions several hives noted to upper extremities and face  Psychiatric: She has a normal mood and affect. Her behavior is normal. Judgment and thought content normal.  Nursing note and vitals reviewed.    ED Treatments / Results  Labs (all labs ordered are listed, but only abnormal results are displayed) Labs Reviewed - No data to display  EKG None  Radiology No results found.  Procedures Procedures (including critical care time)  Medications Ordered in ED Medications  diphenhydrAMINE (BENADRYL) capsule 25 mg (25 mg Oral Given 06/17/17 1246)  predniSONE (DELTASONE) tablet 40 mg (40 mg Oral Given 06/17/17 1344)     Initial Impression /  Assessment and Plan / ED Course  I have reviewed the triage vital signs and the nursing notes.  Pertinent labs & imaging results that were available during my care of the patient were reviewed by me and considered in my medical decision making (see chart for details).     Labs:   Imaging:  Consults:  Therapeutics: Fernisone  Discharge Meds: Prednisone  Assessment/Plan: 62 year old female presents today with allergic reaction.  This is likely secondary to recent change in blood pressure medication, she has no other new exposures.  She has no true angioedema, or signs of anaphylactic reaction.  Patient will be treated with steroids given the involvement of the face and her comfort level.  Patient will follow-up with her primary care this week as she is hypertensive here.  Patient is given strict return precautions, she verbalized understanding and agreement to today's plan had no further questions or concerns.  Patient reports that antihistamines make her blood pressure go up as well and she has been avoiding them.     Final Clinical Impressions(s) / ED Diagnoses   Final diagnoses:  Allergic reaction, initial encounter    ED Discharge Orders        Ordered    predniSONE (DELTASONE) 10 MG tablet     06/17/17 1329       Okey Regal, PA-C 06/17/17 1453    Davonna Belling, MD 06/17/17 2246

## 2017-06-17 NOTE — ED Triage Notes (Signed)
Pt states she started a new medication, olmesartan medoxomil 2 weeks ago. States on Thursday began having an allergic reaction involving hives, swelling to lips. Denies any shortness of breath. No problems swallowing.  Has not taken any benadryl. Pt just had poision oak 2 weeks ago.

## 2017-06-24 DIAGNOSIS — Z1211 Encounter for screening for malignant neoplasm of colon: Secondary | ICD-10-CM | POA: Diagnosis not present

## 2018-03-17 DIAGNOSIS — I10 Essential (primary) hypertension: Secondary | ICD-10-CM | POA: Diagnosis not present

## 2018-03-17 DIAGNOSIS — Z0000 Encounter for general adult medical examination without abnormal findings: Secondary | ICD-10-CM | POA: Diagnosis not present

## 2018-03-17 DIAGNOSIS — E7800 Pure hypercholesterolemia, unspecified: Secondary | ICD-10-CM | POA: Diagnosis not present

## 2018-03-17 DIAGNOSIS — S46912A Strain of unspecified muscle, fascia and tendon at shoulder and upper arm level, left arm, initial encounter: Secondary | ICD-10-CM | POA: Diagnosis not present

## 2018-03-31 DIAGNOSIS — M67912 Unspecified disorder of synovium and tendon, left shoulder: Secondary | ICD-10-CM | POA: Diagnosis not present

## 2018-03-31 DIAGNOSIS — M25512 Pain in left shoulder: Secondary | ICD-10-CM | POA: Diagnosis not present

## 2018-03-31 DIAGNOSIS — M542 Cervicalgia: Secondary | ICD-10-CM | POA: Diagnosis not present

## 2018-06-30 ENCOUNTER — Other Ambulatory Visit: Payer: Self-pay | Admitting: Family Medicine

## 2018-06-30 DIAGNOSIS — Z1231 Encounter for screening mammogram for malignant neoplasm of breast: Secondary | ICD-10-CM

## 2018-07-17 ENCOUNTER — Ambulatory Visit: Payer: 59

## 2018-07-21 ENCOUNTER — Other Ambulatory Visit: Payer: Self-pay

## 2018-07-21 ENCOUNTER — Ambulatory Visit
Admission: RE | Admit: 2018-07-21 | Discharge: 2018-07-21 | Disposition: A | Discharge status: Home / Self Care | Payer: 59 | Source: Ambulatory Visit | Attending: Family Medicine | Admitting: Family Medicine

## 2018-07-21 DIAGNOSIS — Z1231 Encounter for screening mammogram for malignant neoplasm of breast: Secondary | ICD-10-CM

## 2020-10-20 IMAGING — MG DIGITAL SCREENING BILATERAL MAMMOGRAM WITH TOMO AND CAD
6 of 10 series · 6 of 30 positions shown · non-contrast
Comparison: Previous exam(s).

CLINICAL DATA: Screening.

EXAM:
DIGITAL SCREENING BILATERAL MAMMOGRAM WITH TOMO AND CAD

[R MLO synth-2D (1 of 2)]
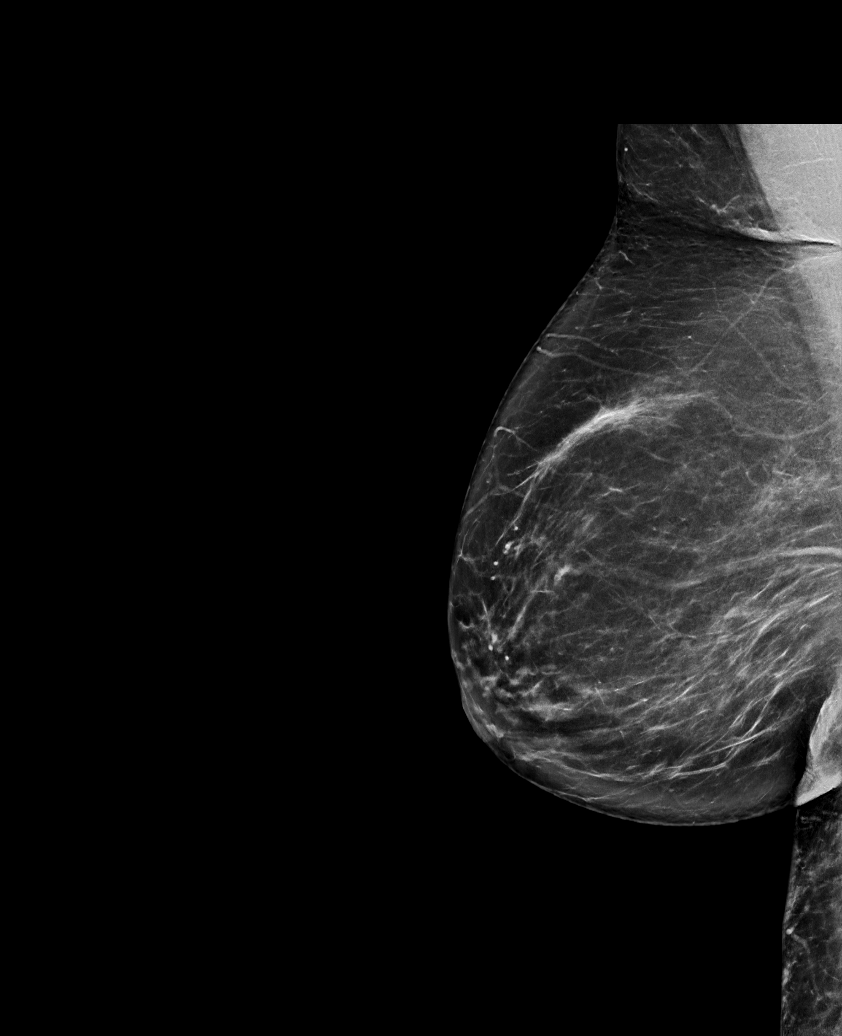

[L MLO synth-2D]
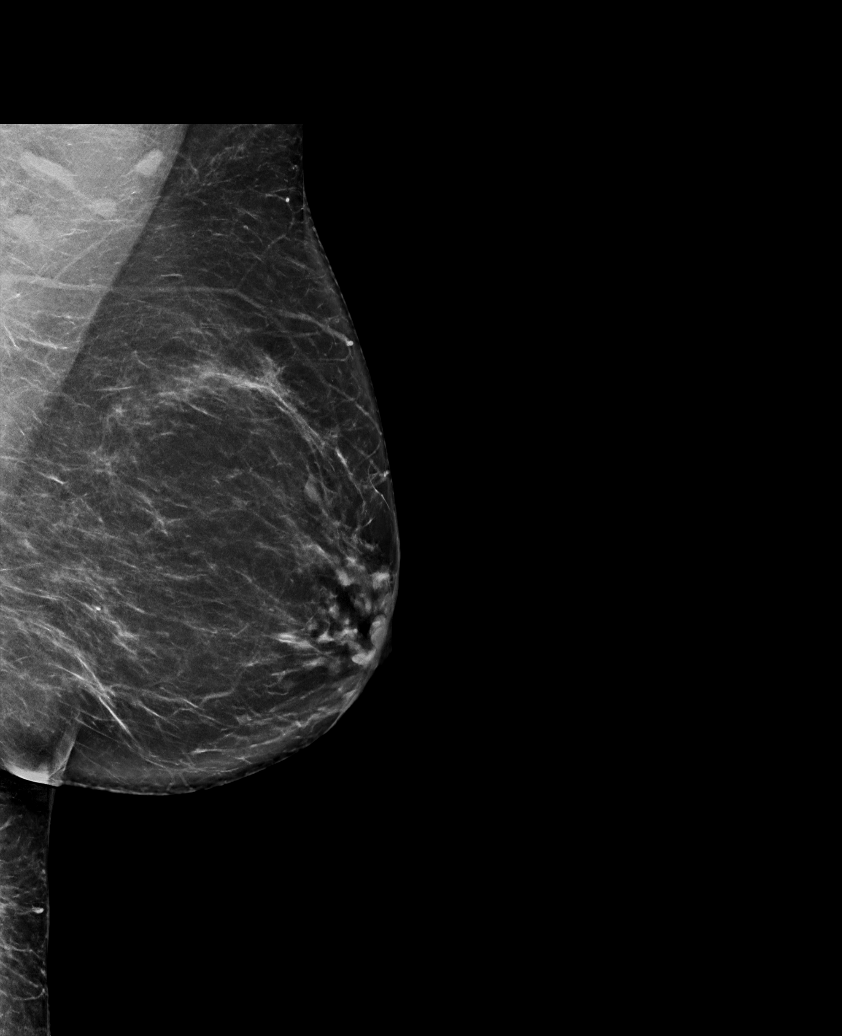

[R CC synth-2D]
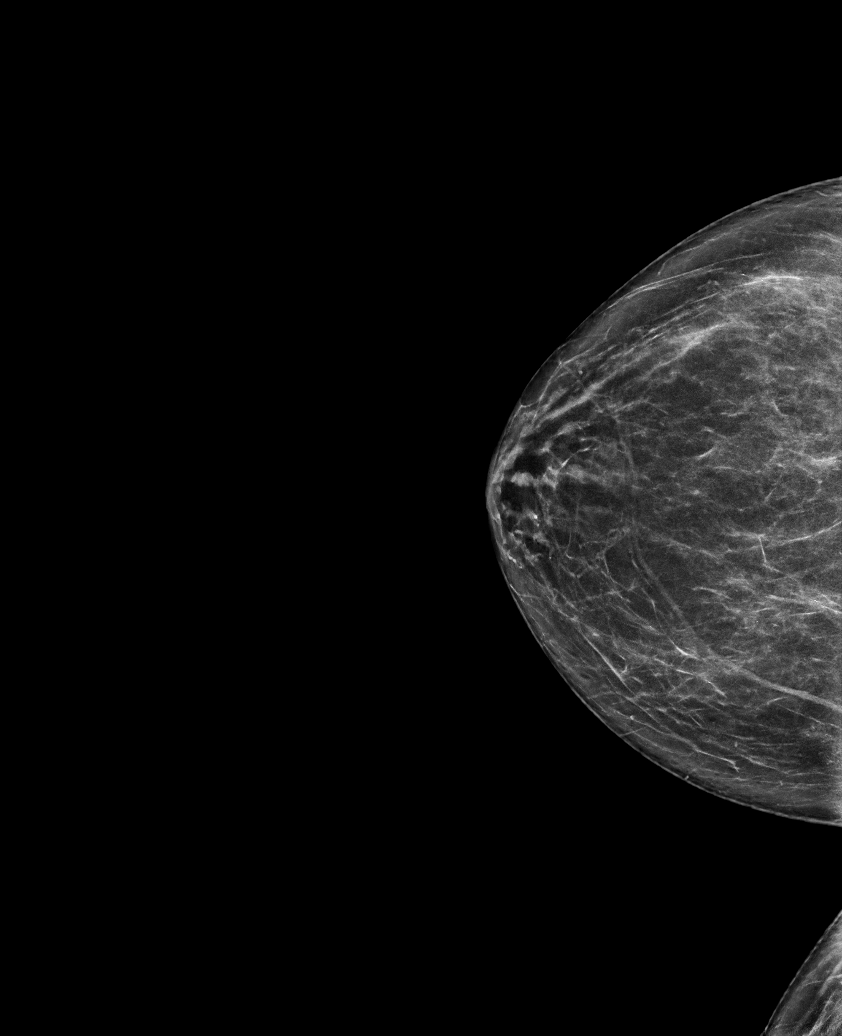

[R MLO synth-2D (2 of 2)]
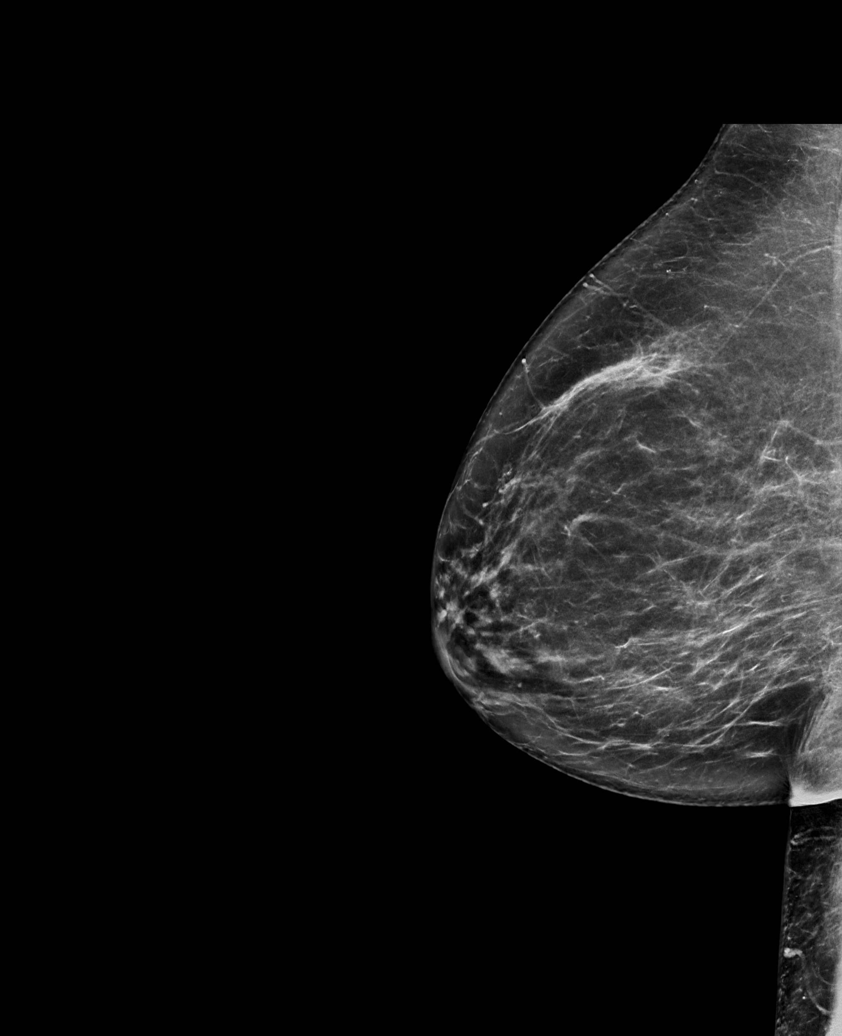

[L CC synth-2D]
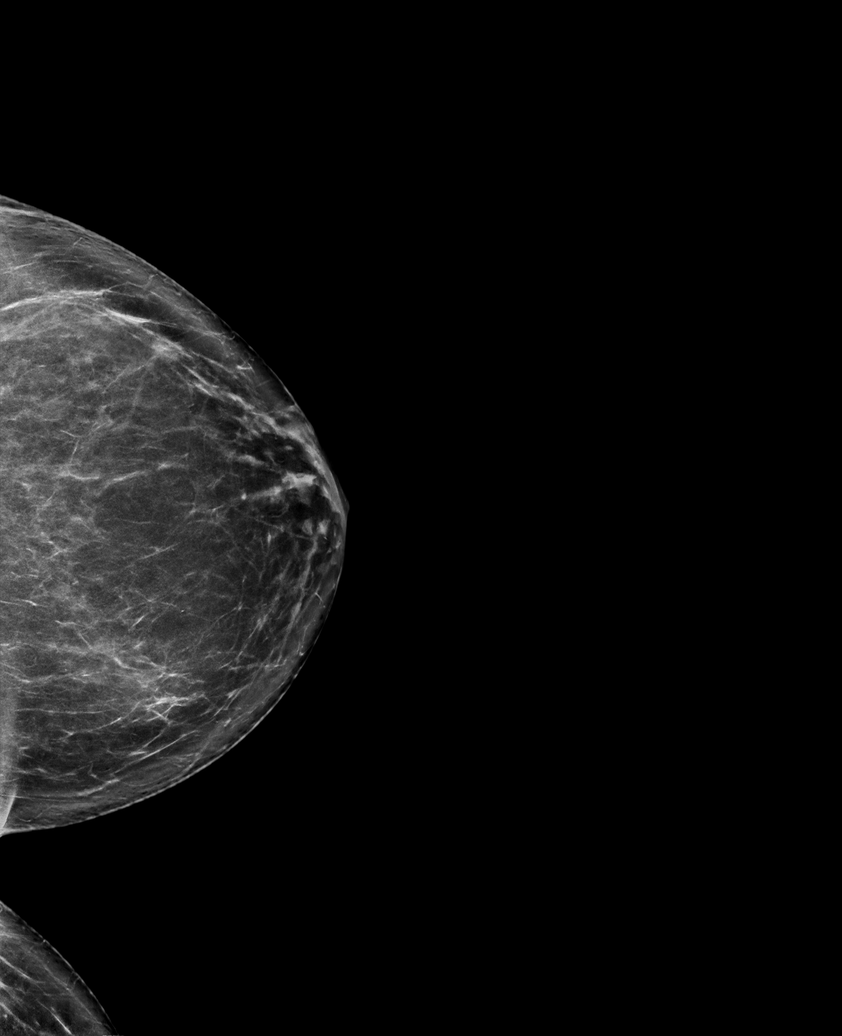

[R MLO tomo · tomo slice 43/84.0]
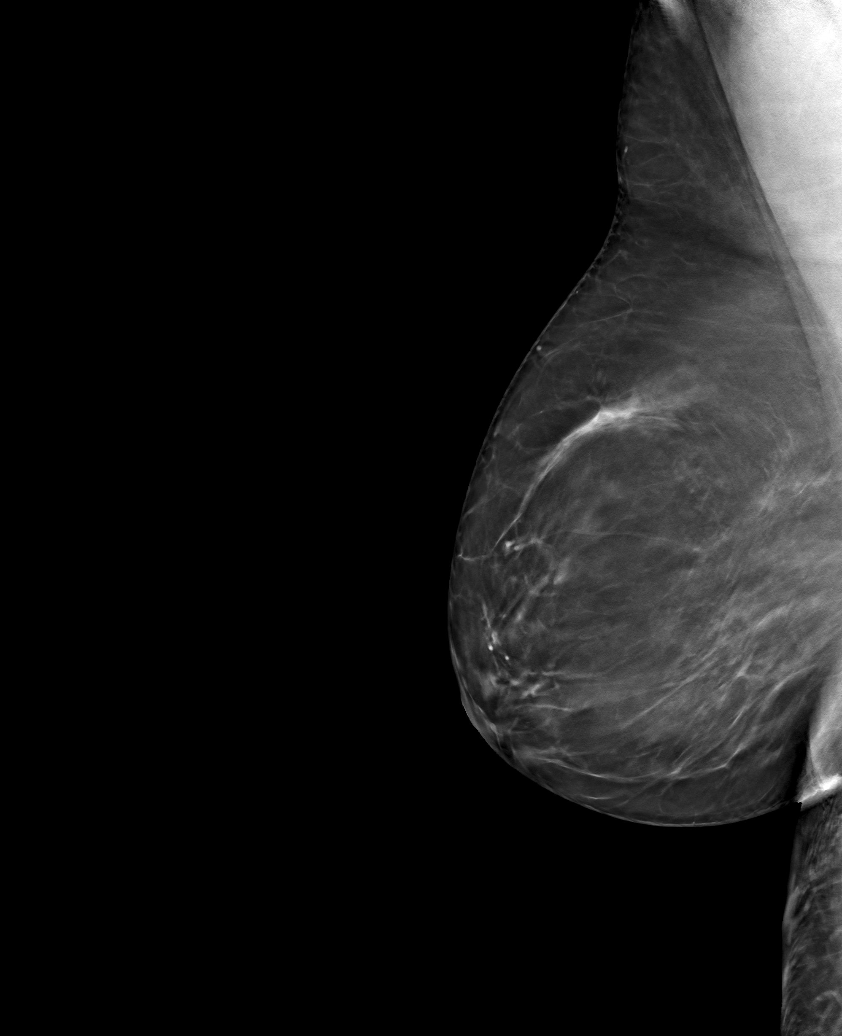

[6 of 30 positions shown; findings below may reference images not displayed]

ACR Breast Density Category b: There are scattered areas of
fibroglandular density.
FINDINGS: There are no findings suspicious for malignancy. Images were
processed with CAD.
IMPRESSION: No mammographic evidence of malignancy. A result letter of this
screening mammogram will be mailed directly to the patient.

RECOMMENDATION:
Screening mammogram in one year. (Code:[id number])

BI-RADS CATEGORY  1: Negative.

## 2020-11-30 ENCOUNTER — Ambulatory Visit (INDEPENDENT_AMBULATORY_CARE_PROVIDER_SITE_OTHER): Payer: Self-pay

## 2020-11-30 ENCOUNTER — Ambulatory Visit (HOSPITAL_COMMUNITY)
Admission: EM | Admit: 2020-11-30 | Discharge: 2020-11-30 | Disposition: A | Discharge status: Home / Self Care | Payer: Self-pay

## 2020-11-30 ENCOUNTER — Other Ambulatory Visit: Payer: Self-pay

## 2020-11-30 ENCOUNTER — Encounter (HOSPITAL_COMMUNITY): Payer: Self-pay | Admitting: Emergency Medicine

## 2020-11-30 DIAGNOSIS — R059 Cough, unspecified: Secondary | ICD-10-CM

## 2020-11-30 DIAGNOSIS — R051 Acute cough: Secondary | ICD-10-CM

## 2020-11-30 DIAGNOSIS — R0981 Nasal congestion: Secondary | ICD-10-CM

## 2020-11-30 DIAGNOSIS — J209 Acute bronchitis, unspecified: Secondary | ICD-10-CM

## 2020-11-30 HISTORY — DX: Benign neoplasm of connective and other soft tissue, unspecified: D21.9

## 2020-11-30 MED ORDER — ALBUTEROL SULFATE HFA 108 (90 BASE) MCG/ACT IN AERS: 1.0000 | INHALATION_SPRAY | Freq: Four times a day (QID) | RESPIRATORY_TRACT | 0 refills | Status: AC | PRN

## 2020-11-30 MED ORDER — PREDNISONE 10 MG PO TABS: ORAL_TABLET | ORAL | 0 refills | Status: DC

## 2020-11-30 MED ORDER — PREDNISONE 10 MG (21) PO TBPK: ORAL_TABLET | Freq: Every day | ORAL | 0 refills | Status: AC

## 2020-11-30 MED ORDER — LEVOCETIRIZINE DIHYDROCHLORIDE 5 MG PO TABS: 5.0000 mg | ORAL_TABLET | Freq: Every evening | ORAL | 1 refills | Status: AC

## 2020-11-30 NOTE — ED Triage Notes (Addendum)
Patient c/o productive cough "thick to thin" sputum x 4 weeks.   Patient denies SOB. Patient denies fever.    Patient endorses chest pain when coughing.   History of Seasonal Allergies.   Patient endorses taking Coricidin with no relief of symptoms.

## 2020-11-30 NOTE — ED Provider Notes (Signed)
[place]    CSN: [id number] Arrival date & time: 11/30/20  0946      History   Chief Complaint Chief Complaint  Patient presents with   Cough    HPI Meghan [name] is a 65 y.o. female.   Cough, congestion and intertmit sob for several weeks. She had taken meds and felt better for a few days then began to feel bad. Has been taking coricidin otc with no relief. Pt is concerned of pneumonia. Denies any n/v/d.    Past Medical History:  Diagnosis Date   Fibroids    Hypertension     There are no problems to display for this patient.   History reviewed. No pertinent surgical history.  OB History   No obstetric history on file.      Home Medications    Prior to Admission medications   Medication Sig Start Date End Date Taking? Authorizing Provider  albuterol (VENTOLIN HFA) 108 (90 Base) MCG/ACT inhaler Inhale 1-2 puffs into the lungs every 6 (six) hours as needed for wheezing or shortness of breath. 11/30/20  Yes Marney Setting, NP  hydrochlorothiazide (MICROZIDE) 12.5 MG capsule Take 12.5 mg by mouth every morning. 10/02/20  Yes [provider]  levocetirizine (XYZAL ALLERGY 24HR) 5 MG tablet Take 1 tablet (5 mg total) by mouth every evening. 11/30/20  Yes Marney Setting, NP  aspirin EC 81 MG tablet Take 81 mg by mouth daily.    [provider]  azithromycin (ZITHROMAX Z-PAK) 250 MG tablet 2 po day one, then 1 daily x 4 days 11/22/14   Pisciotta, Elmyra Ricks, PA-C  naproxen (NAPROSYN) 500 MG tablet Take 1 tablet (500 mg total) by mouth 2 (two) times daily. 06/09/13   Kirichenko, Tatyana, PA-C  predniSONE (DELTASONE) 10 MG tablet Please take 4 tabs daily for 5 days. Day six 3 tabs, Day seven 2 tabs, Day eight 1 tab 11/30/20   Marney Setting, NP  trimethoprim-polymyxin b (POLYTRIM) ophthalmic solution Place 1 drop into the left eye every 4 (four) hours. 11/22/14   Pisciotta, Charna Elizabeth    Family History History reviewed. No  pertinent family history.  Social History Social History   Tobacco Use   Smoking status: Former    Packs/day: 0.10    Types: Cigarettes    Quit date: 2006    Years since quitting: 16.8   Smokeless tobacco: Never  Substance Use Topics   Alcohol use: Yes   Drug use: No     Allergies   Patient has no known allergies.   Review of Systems Review of Systems  Constitutional:  Negative for chills and fever.  HENT:  Positive for congestion, postnasal drip, rhinorrhea, sinus pressure, sinus pain, sneezing and sore throat.   Eyes: Negative.   Respiratory:  Positive for cough. Negative for shortness of breath.   Cardiovascular: Negative.   Gastrointestinal: Negative.   Genitourinary: Negative.   Musculoskeletal: Negative.   Neurological: Negative.     Physical Exam Triage Vital Signs ED Triage Vitals  Enc Vitals Group     BP 11/30/20 1031 (!) 156/77     Pulse Rate 11/30/20 1031 90     Resp 11/30/20 1031 16     Temp 11/30/20 1031 98.5 F (36.9 C)     Temp Source 11/30/20 1031 Oral     SpO2 11/30/20 1031 97 %     Weight --      Height --      Head Circumference --  Peak Flow --      Pain Score 11/30/20 1034 0     Pain Loc --      Pain Edu? --      Excl. in [place]? --    No data found.  Updated Vital Signs BP (!) 156/77 (BP Location: Left Arm)   Pulse 90   Temp 98.5 F (36.9 C) (Oral)   Resp 16   SpO2 97%   Visual Acuity Right Eye Distance:   Left Eye Distance:   Bilateral Distance:    Right Eye Near:   Left Eye Near:    Bilateral Near:     Physical Exam Constitutional:      Appearance: Normal appearance.  HENT:     Right Ear: Tympanic membrane normal.     Left Ear: Tympanic membrane normal.     Nose: Congestion and rhinorrhea present.     Mouth/Throat:     Pharynx: Posterior oropharyngeal erythema present.  Eyes:     Conjunctiva/sclera: Conjunctivae normal.     Pupils: Pupils are equal, round, and reactive to light.  Cardiovascular:     Rate  and Rhythm: Normal rate.  Pulmonary:     Effort: Pulmonary effort is normal.     Breath sounds: Normal breath sounds.  Abdominal:     General: Abdomen is flat.  Musculoskeletal:     Cervical back: Normal range of motion.  Skin:    General: Skin is warm.  Neurological:     Mental Status: She is alert.     UC Treatments / Results  Labs (all labs ordered are listed, but only abnormal results are displayed) Labs Reviewed - No data to display  EKG   Radiology DG Chest 2 View  Result Date: 11/30/2020 CLINICAL DATA:  Productive cough. EXAM: CHEST - 2 VIEW COMPARISON:  11/22/2014 FINDINGS: The lungs are clear without focal pneumonia, edema, pneumothorax or pleural effusion. The cardiopericardial silhouette is within normal limits for size. The visualized bony structures of the thorax show no acute abnormality. IMPRESSION: No active cardiopulmonary disease. Electronically Signed   By: Misty Stanley M.D.   On: 11/30/2020 10:56    Procedures Procedures (including critical care time)  Medications Ordered in UC Medications - No data to display  Initial Impression / Assessment and Plan / UC Course  I have reviewed the triage vital signs and the nursing notes.  Pertinent labs & imaging results that were available during my care of the patient were reviewed by me and considered in my medical decision making (see chart for details).     Cont to take tylenol motrin  Can take otc cold and cough avoid any meds with decongestants can elevated bp  Stay hydrated well  Most likely the viral cold like symptoms  If sx become worse go to  er  Final Clinical Impressions(s) / UC Diagnoses   Final diagnoses:  Acute cough  Acute bronchitis, unspecified organism  Nasal congestion   Discharge Instructions   None    ED Prescriptions     Medication Sig Dispense Auth. Provider   predniSONE (DELTASONE) 10 MG tablet Please take 4 tabs daily for 5 days. Day six 3 tabs, Day seven 2 tabs, Day  eight 1 tab 26 tablet Morley Kos L, NP   albuterol (VENTOLIN HFA) 108 (90 Base) MCG/ACT inhaler Inhale 1-2 puffs into the lungs every 6 (six) hours as needed for wheezing or shortness of breath. 90 g Marney Setting, NP   levocetirizine (XYZAL ALLERGY 24HR)  5 MG tablet Take 1 tablet (5 mg total) by mouth every evening. 30 tablet Marney Setting, NP      PDMP not reviewed this encounter.   Marney Setting, NP 11/30/20 1143

## 2021-11-08 ENCOUNTER — Other Ambulatory Visit: Payer: Self-pay | Admitting: Internal Medicine

## 2021-11-08 DIAGNOSIS — Z1231 Encounter for screening mammogram for malignant neoplasm of breast: Secondary | ICD-10-CM

## 2021-12-29 ENCOUNTER — Ambulatory Visit
Admission: RE | Admit: 2021-12-29 | Discharge: 2021-12-29 | Disposition: A | Discharge status: Home / Self Care | Payer: Commercial Managed Care - HMO | Source: Ambulatory Visit | Attending: Internal Medicine | Admitting: Internal Medicine

## 2021-12-29 DIAGNOSIS — Z1231 Encounter for screening mammogram for malignant neoplasm of breast: Secondary | ICD-10-CM

## 2022-12-05 ENCOUNTER — Other Ambulatory Visit: Payer: Self-pay | Admitting: Internal Medicine

## 2022-12-05 DIAGNOSIS — Z1231 Encounter for screening mammogram for malignant neoplasm of breast: Secondary | ICD-10-CM

## 2023-01-10 ENCOUNTER — Ambulatory Visit
Admission: RE | Admit: 2023-01-10 | Discharge: 2023-01-10 | Disposition: A | Discharge status: Home / Self Care | Payer: Medicare Other | Source: Ambulatory Visit | Attending: Internal Medicine | Admitting: Internal Medicine

## 2023-01-10 DIAGNOSIS — Z1231 Encounter for screening mammogram for malignant neoplasm of breast: Secondary | ICD-10-CM

## 2023-03-02 IMAGING — DX DG CHEST 2V
2 series · 2 of 2 positions shown · non-contrast
Comparison: 11/22/2014

CLINICAL DATA: Productive cough.

EXAM:
CHEST - 2 VIEW

[chest pa]
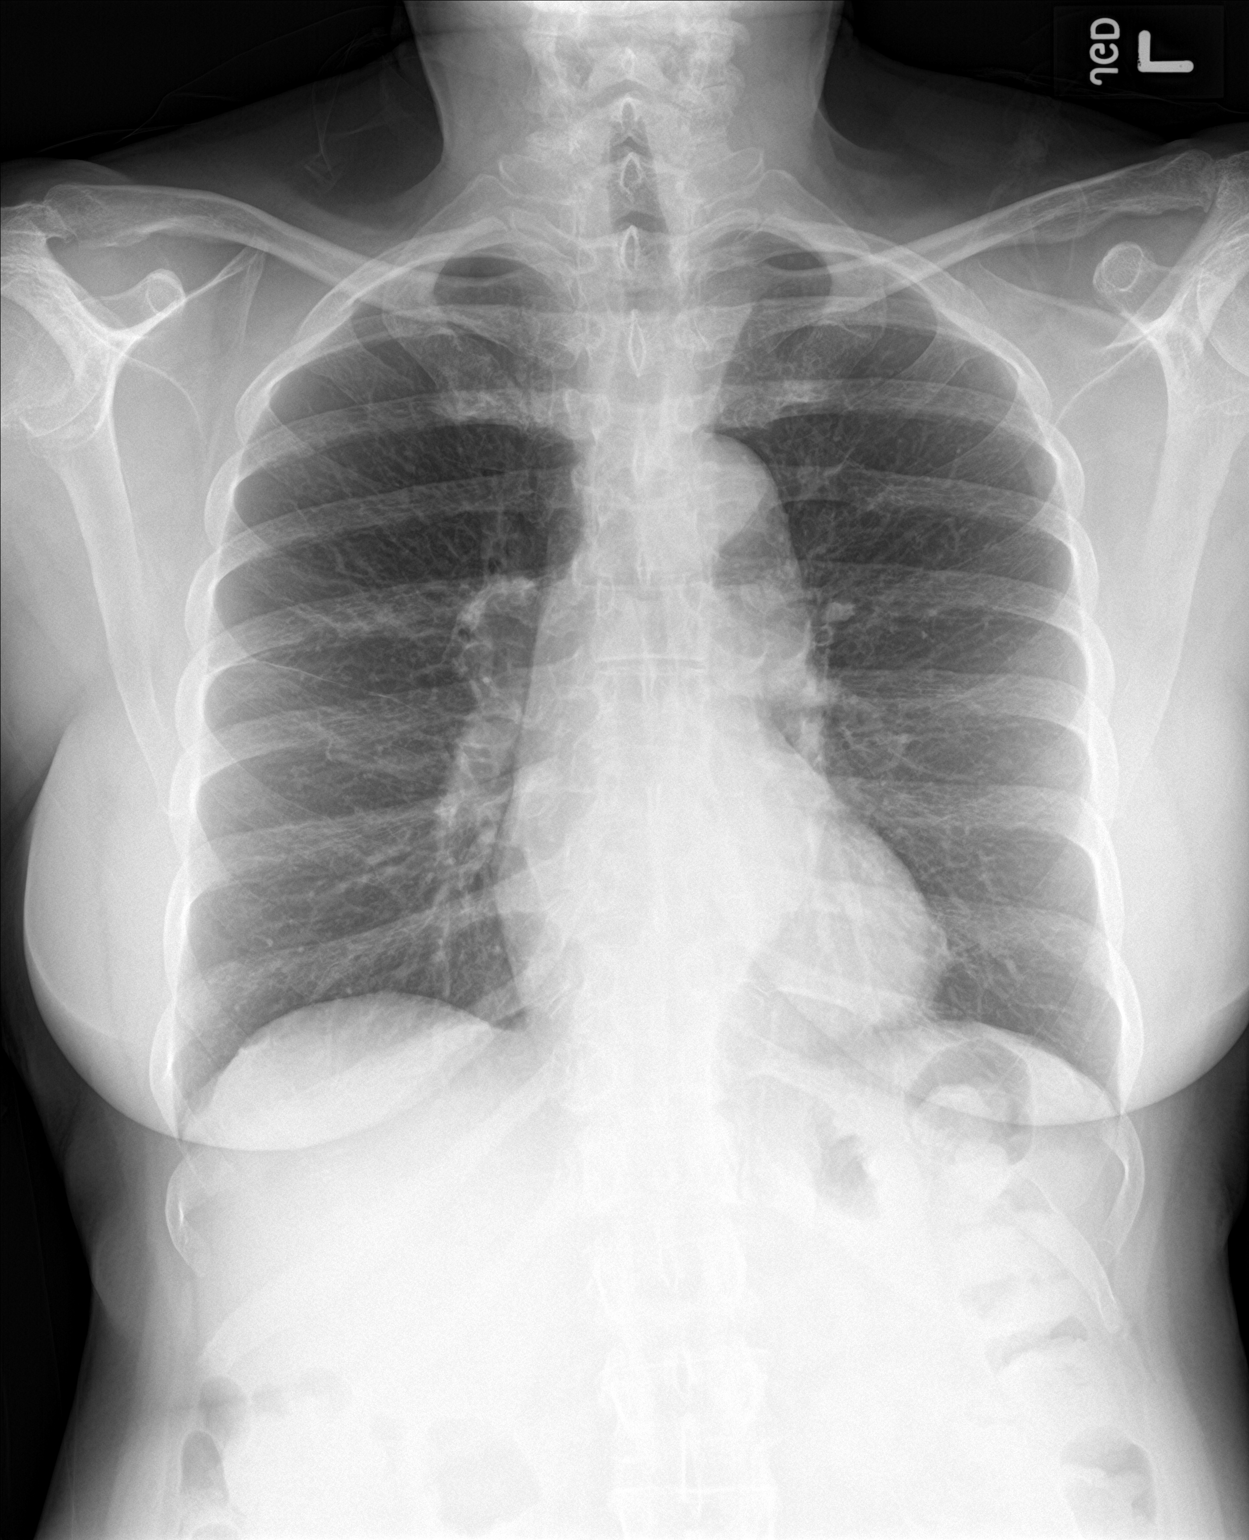

[chest lat]
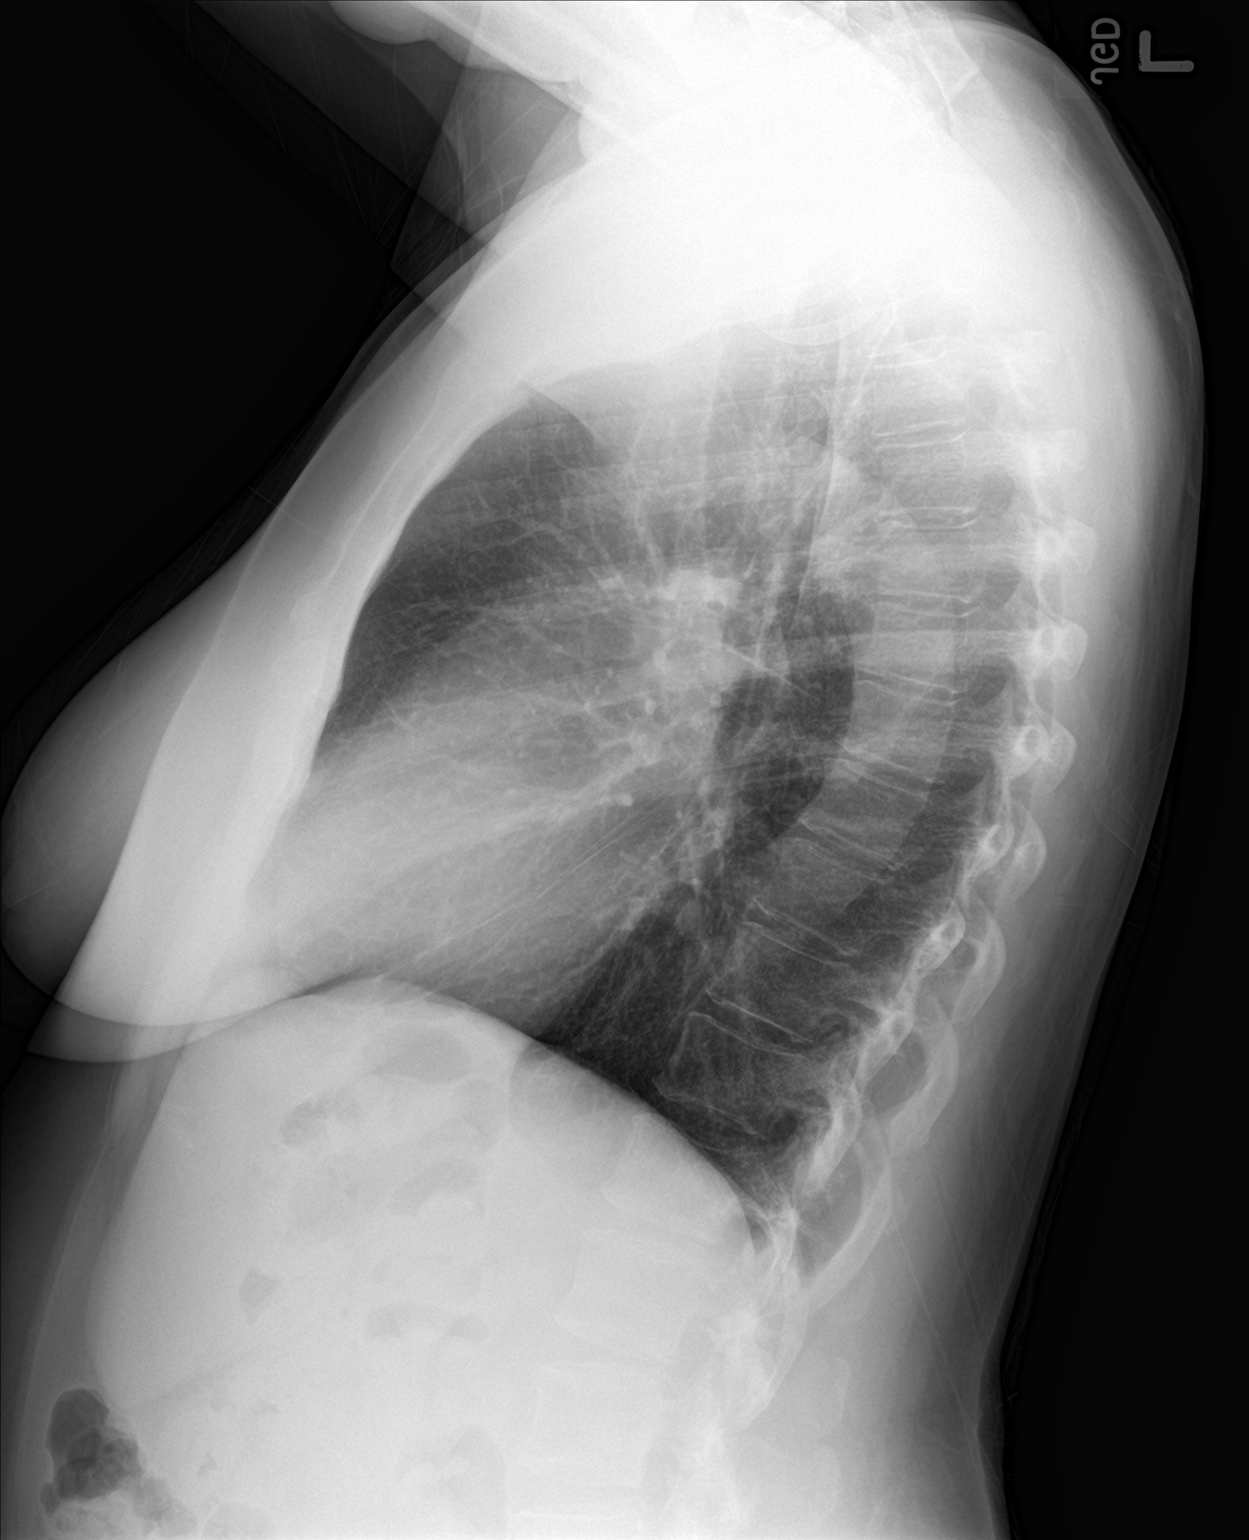

[2 of 2 positions shown; findings below may reference images not displayed]

FINDINGS: The lungs are clear without focal pneumonia, edema, pneumothorax or
pleural effusion. The cardiopericardial silhouette is within normal
limits for size. The visualized bony structures of the thorax show
no acute abnormality.
IMPRESSION: No active cardiopulmonary disease.

## 2023-11-01 ENCOUNTER — Other Ambulatory Visit: Payer: Self-pay | Admitting: Adult Health Nurse Practitioner

## 2023-11-01 DIAGNOSIS — Z1231 Encounter for screening mammogram for malignant neoplasm of breast: Secondary | ICD-10-CM

## 2024-01-13 ENCOUNTER — Ambulatory Visit
Admission: RE | Admit: 2024-01-13 | Discharge: 2024-01-13 | Disposition: A | Discharge status: Home / Self Care | Source: Ambulatory Visit | Attending: Adult Health Nurse Practitioner | Admitting: Adult Health Nurse Practitioner

## 2024-01-13 DIAGNOSIS — Z1231 Encounter for screening mammogram for malignant neoplasm of breast: Secondary | ICD-10-CM
